# Patient Record
Sex: Female | Born: 1995 | Race: Black or African American | Hispanic: No | Marital: Single | Smoking: Never smoker
Health system: Southern US, Community
[De-identification: ages and names within clinical notes are randomized; demographics above are authoritative.]

---
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
## 2017-03-05 ENCOUNTER — Encounter: Payer: Self-pay | Admitting: Family Medicine

## 2017-03-05 ENCOUNTER — Ambulatory Visit (INDEPENDENT_AMBULATORY_CARE_PROVIDER_SITE_OTHER): Payer: BLUE CROSS/BLUE SHIELD | Admitting: Family Medicine

## 2017-03-05 VITALS — BP 120/82 | HR 79 | Temp 97.1°F | Resp 14

## 2017-03-05 DIAGNOSIS — J069 Acute upper respiratory infection, unspecified: Secondary | ICD-10-CM

## 2017-03-05 MED ORDER — ALBUTEROL SULFATE HFA 108 (90 BASE) MCG/ACT IN AERS: 2.0000 | INHALATION_SPRAY | Freq: Four times a day (QID) | RESPIRATORY_TRACT | 2 refills | Status: AC | PRN

## 2017-03-05 MED ORDER — AZITHROMYCIN 250 MG PO TABS: ORAL_TABLET | ORAL | 0 refills | Status: DC

## 2017-03-05 NOTE — Progress Notes (Signed)
Patient presents today for symptoms of nasal congestion and productive cough. Patient states that she's had the symptoms for the last 2 weeks. She also has asthma and has been using her albuterol inhaler once to twice daily. She denies any wheezing or shortness of breath now. She denies any fever. She does take a oral antihistamine daily for allergies. She denies any chest pain or night sweats. She has been taking Mucinex for her symptoms.  ROS: Negative except mentioned above. Vitals as per Epic. GENERAL: NAD HEENT: no pharyngeal erythema, no exudate RESP: CTA B, no wheezing or accessory muscle use appreciated CARD: RRR NEURO: CN II-XII grossly intact   A/P: URI - will treat with Z-Pak, Albuterol inhaler refilled, Delsym when necessary, continue oral antihistamine, rest, hydration, seek medical attention if symptoms persist or worsen as discussed. Can attempt athletic activity as long as afebrile.

## 2017-08-05 ENCOUNTER — Ambulatory Visit (INDEPENDENT_AMBULATORY_CARE_PROVIDER_SITE_OTHER): Payer: BLUE CROSS/BLUE SHIELD | Admitting: Family Medicine

## 2017-08-05 ENCOUNTER — Encounter: Payer: Self-pay | Admitting: Family Medicine

## 2017-08-05 ENCOUNTER — Other Ambulatory Visit: Payer: Self-pay | Admitting: Family Medicine

## 2017-08-05 DIAGNOSIS — G8929 Other chronic pain: Secondary | ICD-10-CM

## 2017-08-05 DIAGNOSIS — M25562 Pain in left knee: Secondary | ICD-10-CM

## 2017-08-05 NOTE — Progress Notes (Signed)
Patient with history of left knee pain for the last 3 months. Patient denies any 1 particular incident of trauma at that time. She states her symptoms are of instability at times, locking and clicking of the knee. She denies any significant swelling. She does have symptoms with change of direction with activity and full extension of the knee. She denies any previous history of knee pain. She has been able to continue to do activity through the discomfort.  ROS: Negative except mentioned above. Vitals as per Epic. GENERAL: NAD MSK: L Knee - genu valgum, no significant effusion, range of motion slightly limited with full extension and flexion due to pain, mild medial and lateral joint line tenderness, mild patella facet tenderness, negative Lachman, negative Drawer, negative McMurray, no significant varus or valgus instability NEURO: CN II-XII grossly intact   A/P: Chronic left knee pain - will do x-rays and MRI to further evaluate for any cartilage injury, will avoid activities that cause symptoms for now, NSAIDs when necessary, will discuss further with athletic trainer, seek medical attention if any acute problems.

## 2017-08-06 ENCOUNTER — Ambulatory Visit
Admission: RE | Admit: 2017-08-06 | Discharge: 2017-08-06 | Disposition: A | Discharge status: Home / Self Care | Payer: 59 | Source: Ambulatory Visit | Attending: Family Medicine | Admitting: Family Medicine

## 2017-08-06 DIAGNOSIS — G8929 Other chronic pain: Secondary | ICD-10-CM | POA: Diagnosis present

## 2017-08-06 DIAGNOSIS — M25562 Pain in left knee: Secondary | ICD-10-CM | POA: Diagnosis not present

## 2017-08-06 DIAGNOSIS — M25462 Effusion, left knee: Secondary | ICD-10-CM | POA: Insufficient documentation

## 2017-08-06 DIAGNOSIS — M25762 Osteophyte, left knee: Secondary | ICD-10-CM | POA: Insufficient documentation

## 2019-09-07 IMAGING — MR MR KNEE*L* W/O CM
7 series · 39 of 40 positions shown · non-contrast
Comparison: Left knee x-rays from same day.

CLINICAL DATA: Knee pain and mechanical symptoms in April 2017.
No known injury.

EXAM:
MRI OF THE LEFT KNEE WITHOUT CONTRAST
TECHNIQUE: Multiplanar, multisequence MR imaging of the knee was performed. No
intravenous contrast was administered.

[Series 3: PD fat-sat · axial · 3.0mm · 0.50mm/px · z∈[-67,+58]mm · 6 of 39 slices shown (1 of 4)]
[im 1/39]
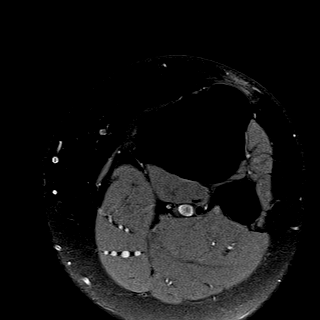
[im 8/39]
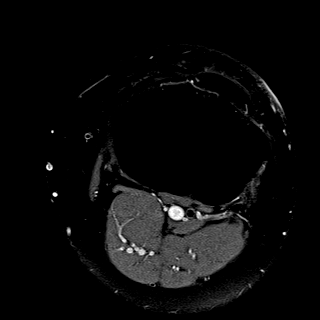
[im 16/39]
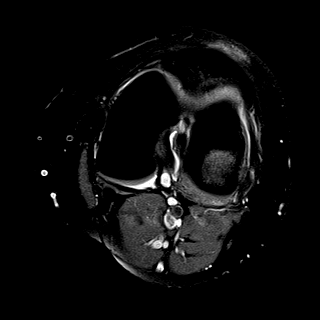
[im 23/39]
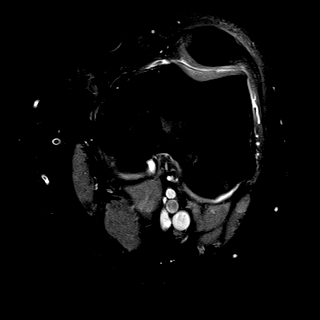
[im 31/39]
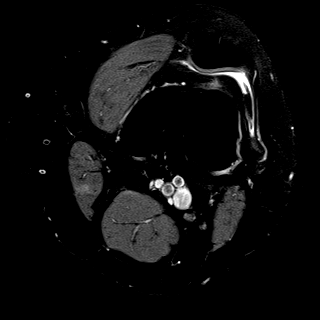
[im 39/39]
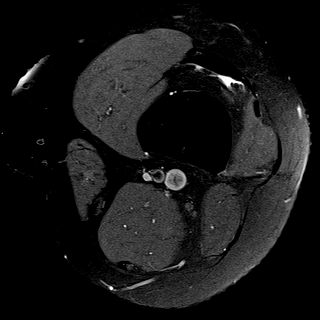

[Series 4: T1 · coronal · 3.0mm · 0.56mm/px · 6 of 33 slices shown]
[im 1/33]
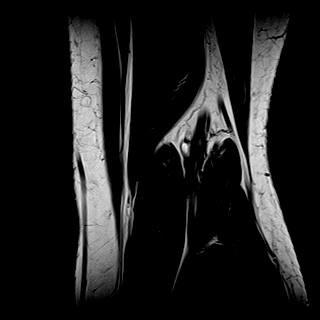
[im 7/33]
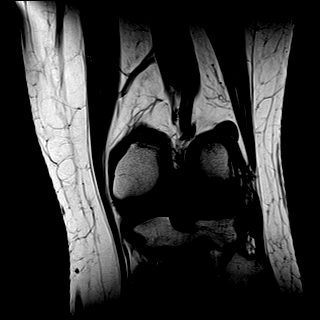
[im 13/33]
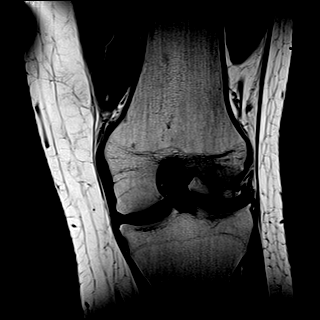
[im 20/33]
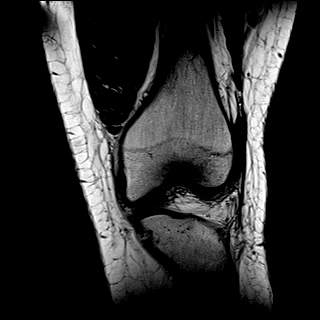
[im 26/33]
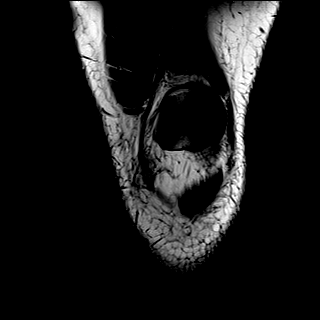
[im 33/33]
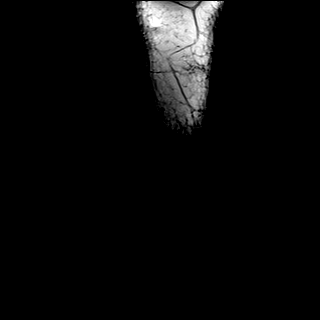

[Series 5: T2 fat-sat · coronal · 3.0mm · 0.35mm/px · 6 of 33 slices shown]
[im 1/33]
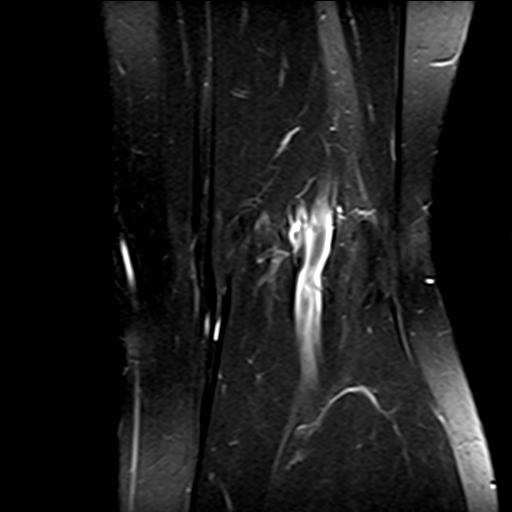
[im 7/33]
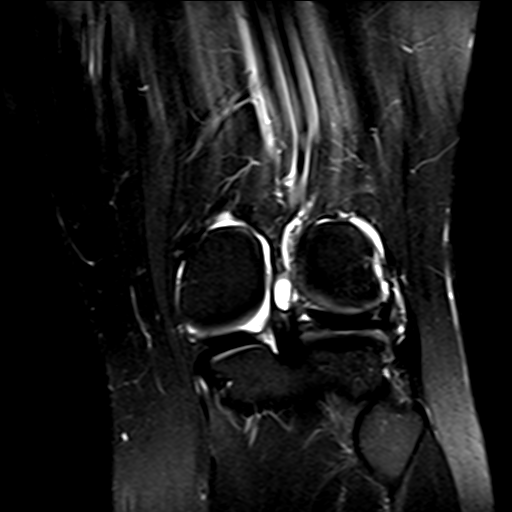
[im 13/33]
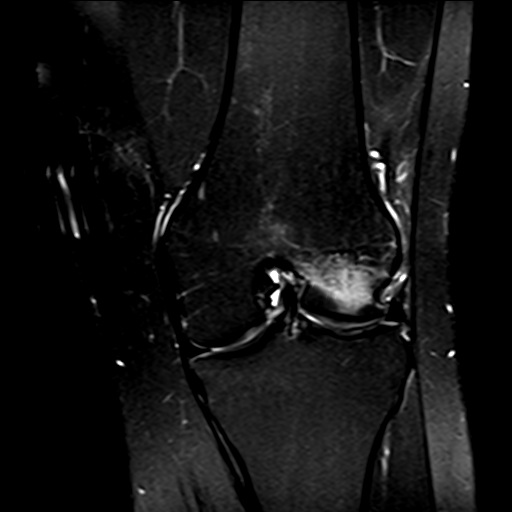
[im 20/33]
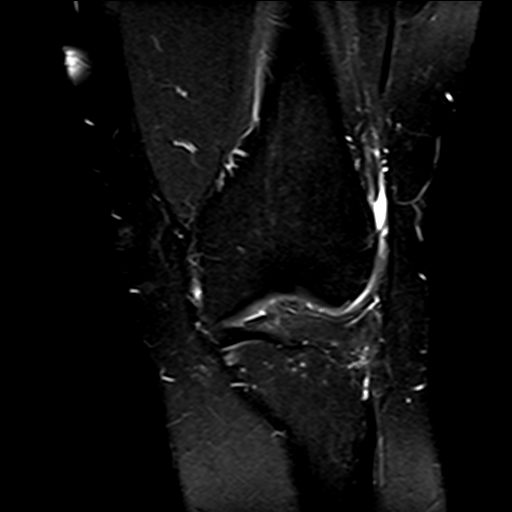
[im 26/33]
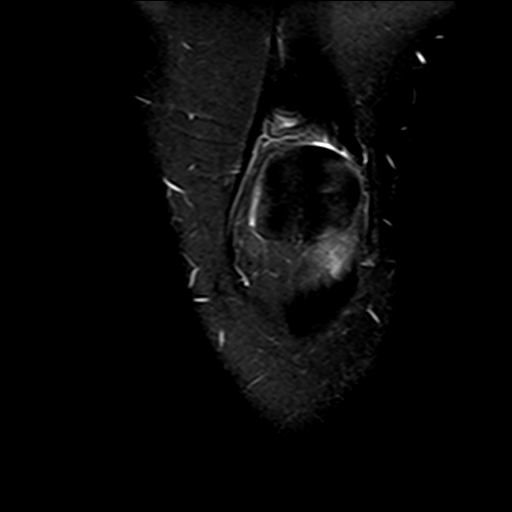
[im 33/33]
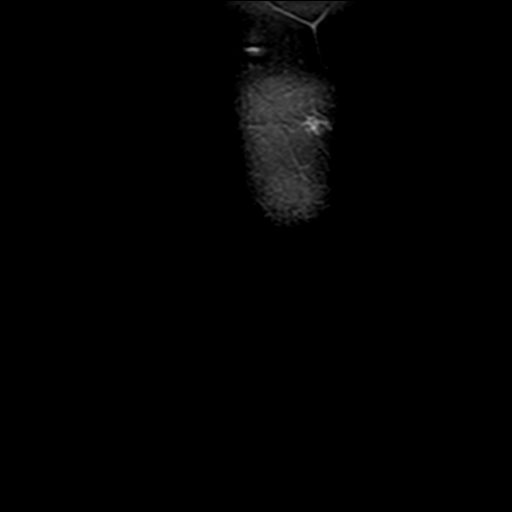

[Series 6: PD fat-sat · coronal · 3.0mm · 0.56mm/px · 6 of 33 slices shown (2 of 4)]
[im 1/33]
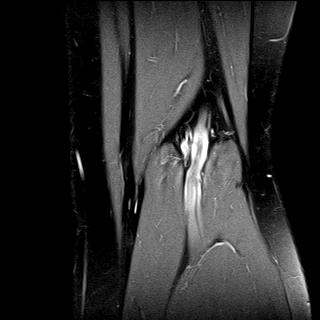
[im 7/33]
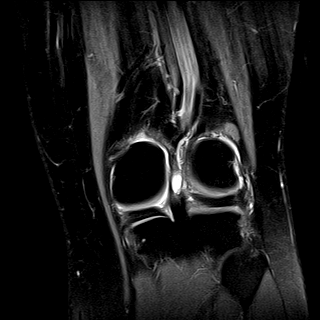
[im 13/33]
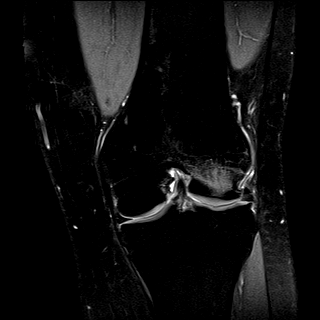
[im 20/33]
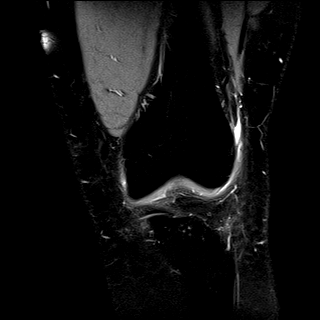
[im 26/33]
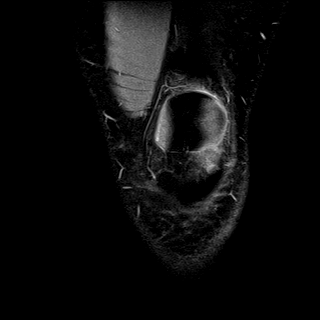
[im 33/33]
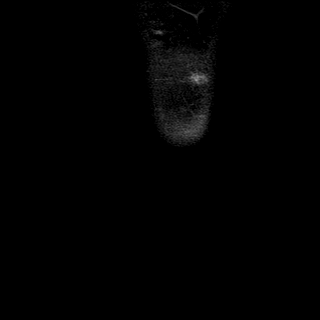

[Series 7: PD fat-sat · sagittal · 3.0mm · 0.50mm/px · 6 of 37 slices shown (3 of 4)]
[im 1/37]
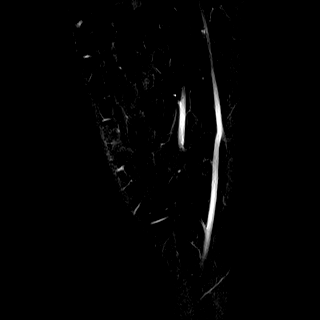
[im 8/37]
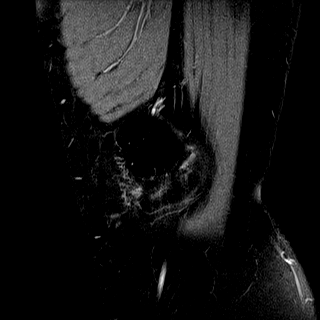
[im 15/37]
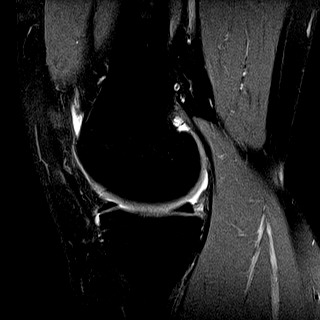
[im 22/37]
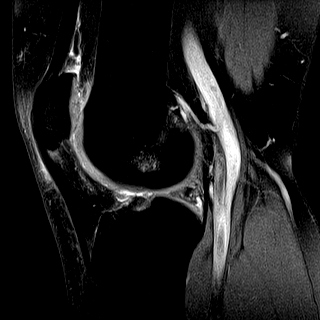
[im 29/37]
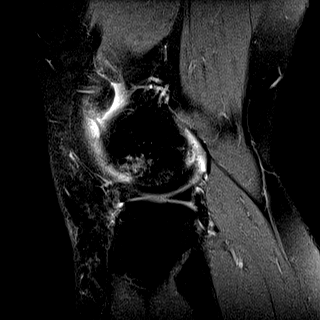
[im 37/37]
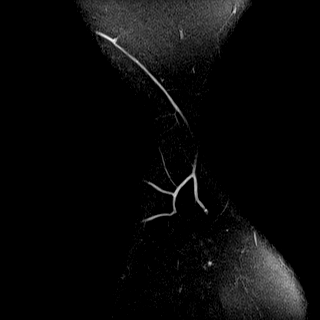

[Series 8: STIR · sagittal · 3.0mm · 0.62mm/px · 5 of 37 slices shown]
[im 1/37]
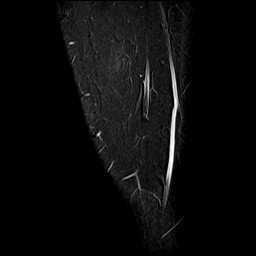
[im 8/37]
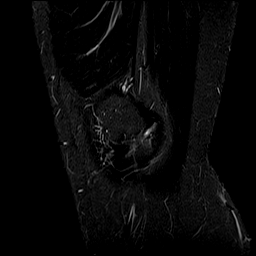
[im 15/37]
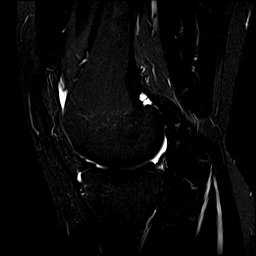
[im 22/37]
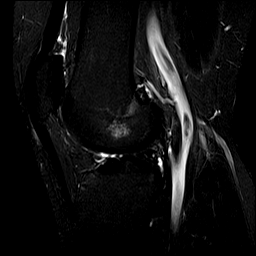
[im 29/37]
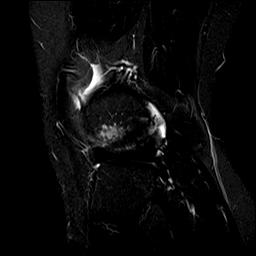

[Series 9: PD fat-sat · oblique · 2.0mm · 0.62mm/px · 4 of 23 slices shown (4 of 4)]
[im 1/23]
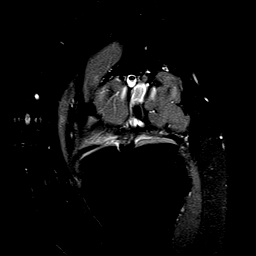
[im 8/23]
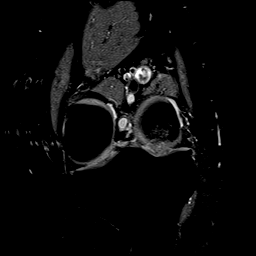
[im 15/23]
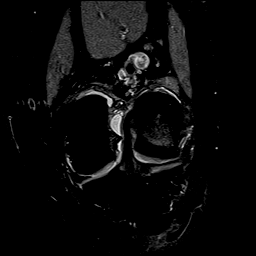
[im 23/23]
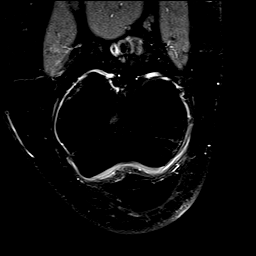

[39 of 40 positions shown; findings below may reference images not displayed]

FINDINGS: MENISCI

Medial meniscus:  Intact.

Lateral meniscus:  Intact.

LIGAMENTS

Cruciates:  Intact ACL and PCL.

Collaterals: Medial collateral ligament is intact. Lateral
collateral ligament complex is intact.

CARTILAGE

Patellofemoral: Mild degenerative hypointense signal within the
cartilage overlying the lateral patellar facet. No chondral defect.

Medial:  No chondral defect.

Lateral: Small, focal near full-thickness cartilage defect over the
posterior weight-bearing lateral femoral condyle, measuring 5 x 5
mm. Irregularity and early degenerative signal within the cartilage
over the central lateral tibial plateau.

Joint: Small joint effusion. Normal Hoffa's fat. No plical
thickening.

Popliteal Fossa:  No Baker cyst. Intact popliteus tendon.

Extensor Mechanism: Intact quadriceps tendon and patellar tendon.
Intact medial and lateral patellar retinaculum. Intact MPFL..

Bones: There is flattening of the lateral femoral condyle with a
subtle small T1 and T2 hypointense fracture line in the posterior
weight-bearing lateral femoral condyle deep to the focal cartilage
defect. There is surrounding marrow edema in the lateral femoral
condyle. Small lateral compartment marginal osteophytes.

Other: None.
IMPRESSION: 1. Focal 5 x 5 mm near full-thickness cartilage defect over the
posterior weight-bearing lateral femoral condyle with small
underlying subchondral fracture, consistent with osteochondral
injury.
2. Associated mild flattening of the lateral femoral condyle with
small marginal osteophytes.
3. Small joint effusion.
